# Patient Record
Sex: Female | Born: 1980 | Hispanic: Yes | Marital: Married | Smoking: Never smoker
Health system: Southern US, Community
[De-identification: ages and names within clinical notes are randomized; demographics above are authoritative.]

## PROBLEM LIST (undated history)

## (undated) HISTORY — PX: BREAST CYST EXCISION: SHX579

---

## 2019-07-31 ENCOUNTER — Emergency Department
Admission: EM | Admit: 2019-07-31 | Discharge: 2019-07-31 | Disposition: A | Discharge status: Home / Self Care | Payer: Medicaid - Out of State | Attending: Emergency Medicine | Admitting: Emergency Medicine

## 2019-07-31 ENCOUNTER — Other Ambulatory Visit: Payer: Self-pay

## 2019-07-31 DIAGNOSIS — M436 Torticollis: Secondary | ICD-10-CM | POA: Insufficient documentation

## 2019-07-31 MED ORDER — CYCLOBENZAPRINE HCL 10 MG PO TABS: 10.0000 mg | ORAL_TABLET | Freq: Three times a day (TID) | ORAL | 0 refills | Status: DC | PRN

## 2019-07-31 MED ORDER — CYCLOBENZAPRINE HCL 10 MG PO TABS
10.0000 mg | ORAL_TABLET | Freq: Once | ORAL | Status: AC
  Administered 2019-07-31: 10 mg via ORAL
  Filled 2019-07-31: qty 1

## 2019-07-31 NOTE — ED Provider Notes (Signed)
[facility] Emergency Department Provider Note _____   First MD Initiated Contact with Patient 07/31/19 0308     (approximate)  I have reviewed the triage vital signs and the nursing notes.   HISTORY  Chief Complaint Torticollis   HPI [name] is a 38 y.o. female [place] Jansky to the emergency department secondary to left lateral neck discomfort worse with movement since this morning.  Patient states that pain is worsened with movement of the neck.  Patient denies any trauma.  Patient denies any fever.  Patient denies any upper or lower extremity weakness numbness.  Patient denies any gait instability or visual changes.        No past medical history on file.  There are no active problems to display for this patient.     Prior to Admission medications   Medication Sig Start Date End Date Taking? Authorizing Provider  cyclobenzaprine (FLEXERIL) 10 MG tablet Take 1 tablet (10 mg total) by mouth 3 (three) times daily as needed. 07/31/19   Darci Current, MD    Allergies Patient has no known allergies.  No family history on file.  Social History Social History   Tobacco Use  . Smoking status: Not on file  Substance Use Topics  . Alcohol use: Not on file  . Drug use: Not on file    Review of Systems Constitutional: No fever/chills Eyes: No visual changes. ENT: No sore throat. Cardiovascular: Denies chest pain. Respiratory: Denies shortness of breath. Gastrointestinal: No abdominal pain.  No nausea, no vomiting.  No diarrhea.  No constipation. Genitourinary: Negative for dysuria. Musculoskeletal: Negative for neck pain.  Negative for back pain. Integumentary: Negative for rash. Neurological: Negative for headaches, focal weakness or numbness.   ____________________________________________   PHYSICAL EXAM:  VITAL SIGNS: ED Triage Vitals  Enc Vitals Group     BP 07/31/19 0030 118/81     Pulse Rate 07/31/19 0030 63   Resp 07/31/19 0030 20     Temp 07/31/19 0030 98 F (36.7 C)     Temp Source 07/31/19 0030 Oral     SpO2 07/31/19 0325 98 %     Weight 07/31/19 0030 73 kg (161 lb)     Height 07/31/19 0030 1.676 m (5\' 6" )     Head Circumference --      Peak Flow --      Pain Score 07/31/19 0030 8     Pain Loc --      Pain Edu? --      Excl. in GC? --     Constitutional: Alert and oriented.  Eyes: Conjunctivae are normal.  Head: Atraumatic.09/30/19 Mouth/Throat: Mucous membranes are moist. Neck: No stridor.  No meningeal signs.   Cardiovascular: Normal rate, regular rhythm. Good peripheral circulation. Grossly normal heart sounds. Respiratory: Normal respiratory effort.  No retractions. Gastrointestinal: Soft and nontender. No distention.  Musculoskeletal: No lower extremity tenderness nor edema. No gross deformities of extremities.  Pain to palpation of the left trapezius muscle.  Pain with active and passive range of motion of the neck Neurologic:  Normal speech and language. No gross focal neurologic deficits are appreciated.  Skin:  Skin is warm, dry and intact. Psychiatric: Mood and affect are normal. Speech and behavior are normal.  _______________  Procedures   ____________________________________________   INITIAL IMPRESSION / MDM / ASSESSMENT AND PLAN / ED COURSE  As part of my medical decision making, I reviewed the following data within the electronic MEDICAL RECORD NUMBER  38 year old female presented with above-stated history and physical exam consistent with acute torticollis.  Patient given Flexeril in the emergency department will be prescribed the same for home.  Given absence of any neurological deficits or signs of cervical radiculopathy imaging not performed  ____________________________________________  FINAL CLINICAL IMPRESSION(S) / ED DIAGNOSES  Final diagnoses:  Torticollis     MEDICATIONS GIVEN DURING THIS VISIT:  Medications  cyclobenzaprine (FLEXERIL) tablet 10 mg  (10 mg Oral Given 07/31/19 0326)     ED Discharge Orders         Ordered    cyclobenzaprine (FLEXERIL) 10 MG tablet  3 times daily PRN     07/31/19 0309          *Please note:  April West was evaluated in Emergency Department on 07/31/2019 for the symptoms described in the history of present illness. She was evaluated in the context of the global COVID-19 pandemic, which necessitated consideration that the patient might be at risk for infection with the SARS-CoV-2 virus that causes COVID-19. Institutional protocols and algorithms that pertain to the evaluation of patients at risk for COVID-19 are in a state of rapid change based on information released by regulatory bodies including the CDC and federal and state organizations. These policies and algorithms were followed during the patient's care in the ED.  Some ED evaluations and interventions may be delayed as a result of limited staffing during the pandemic.*  Note:  This document was prepared using Dragon voice recognition software and may include unintentional dictation errors.   Gregor Hams, MD 07/31/19 [phone]

## 2019-07-31 NOTE — ED Triage Notes (Signed)
Pt in with co left side neck pain radiates to left upper back, states she thinks it is tension. Pt unable to turn head or move neck.

## 2020-01-07 ENCOUNTER — Encounter (HOSPITAL_COMMUNITY): Payer: Self-pay | Admitting: Emergency Medicine

## 2020-01-07 ENCOUNTER — Emergency Department (HOSPITAL_COMMUNITY)
Admission: EM | Admit: 2020-01-07 | Discharge: 2020-01-08 | Disposition: A | Discharge status: Home / Self Care | Payer: No Typology Code available for payment source | Attending: Emergency Medicine | Admitting: Emergency Medicine

## 2020-01-07 ENCOUNTER — Emergency Department (HOSPITAL_COMMUNITY): Payer: No Typology Code available for payment source

## 2020-01-07 ENCOUNTER — Ambulatory Visit (INDEPENDENT_AMBULATORY_CARE_PROVIDER_SITE_OTHER): Payer: Self-pay | Admitting: Certified Nurse Midwife

## 2020-01-07 ENCOUNTER — Encounter: Payer: Self-pay | Admitting: Certified Nurse Midwife

## 2020-01-07 ENCOUNTER — Other Ambulatory Visit: Payer: Self-pay

## 2020-01-07 VITALS — BP 102/68 | HR 67 | Wt 157.7 lb

## 2020-01-07 DIAGNOSIS — S60512A Abrasion of left hand, initial encounter: Secondary | ICD-10-CM | POA: Diagnosis not present

## 2020-01-07 DIAGNOSIS — S30811A Abrasion of abdominal wall, initial encounter: Secondary | ICD-10-CM | POA: Insufficient documentation

## 2020-01-07 DIAGNOSIS — Y999 Unspecified external cause status: Secondary | ICD-10-CM | POA: Diagnosis not present

## 2020-01-07 DIAGNOSIS — M79662 Pain in left lower leg: Secondary | ICD-10-CM | POA: Insufficient documentation

## 2020-01-07 DIAGNOSIS — R0789 Other chest pain: Secondary | ICD-10-CM | POA: Insufficient documentation

## 2020-01-07 DIAGNOSIS — S92325A Nondisplaced fracture of second metatarsal bone, left foot, initial encounter for closed fracture: Secondary | ICD-10-CM | POA: Insufficient documentation

## 2020-01-07 DIAGNOSIS — S70211A Abrasion, right hip, initial encounter: Secondary | ICD-10-CM | POA: Diagnosis not present

## 2020-01-07 DIAGNOSIS — S70212A Abrasion, left hip, initial encounter: Secondary | ICD-10-CM | POA: Diagnosis not present

## 2020-01-07 DIAGNOSIS — S99922A Unspecified injury of left foot, initial encounter: Secondary | ICD-10-CM | POA: Diagnosis present

## 2020-01-07 DIAGNOSIS — M79651 Pain in right thigh: Secondary | ICD-10-CM | POA: Diagnosis not present

## 2020-01-07 DIAGNOSIS — R1084 Generalized abdominal pain: Secondary | ICD-10-CM | POA: Diagnosis not present

## 2020-01-07 DIAGNOSIS — Y92410 Unspecified street and highway as the place of occurrence of the external cause: Secondary | ICD-10-CM | POA: Diagnosis not present

## 2020-01-07 DIAGNOSIS — R52 Pain, unspecified: Secondary | ICD-10-CM

## 2020-01-07 DIAGNOSIS — S20312A Abrasion of left front wall of thorax, initial encounter: Secondary | ICD-10-CM | POA: Insufficient documentation

## 2020-01-07 DIAGNOSIS — S90811A Abrasion, right foot, initial encounter: Secondary | ICD-10-CM | POA: Diagnosis not present

## 2020-01-07 DIAGNOSIS — M25571 Pain in right ankle and joints of right foot: Secondary | ICD-10-CM | POA: Diagnosis not present

## 2020-01-07 DIAGNOSIS — S60511A Abrasion of right hand, initial encounter: Secondary | ICD-10-CM | POA: Insufficient documentation

## 2020-01-07 DIAGNOSIS — S93325A Dislocation of tarsometatarsal joint of left foot, initial encounter: Secondary | ICD-10-CM

## 2020-01-07 DIAGNOSIS — Z3009 Encounter for other general counseling and advice on contraception: Secondary | ICD-10-CM

## 2020-01-07 DIAGNOSIS — T148XXA Other injury of unspecified body region, initial encounter: Secondary | ICD-10-CM

## 2020-01-07 DIAGNOSIS — Y9389 Activity, other specified: Secondary | ICD-10-CM | POA: Insufficient documentation

## 2020-01-07 LAB — I-STAT BETA HCG BLOOD, ED (MC, WL, AP ONLY): I-stat hCG, quantitative: <5 m[IU]/mL (ref <5)

## 2020-01-07 LAB — COMPREHENSIVE METABOLIC PANEL
ALT: 22 U/L (ref 0–44)
AST: 34 U/L (ref 15–41)
Albumin: 4.3 g/dL (ref 3.5–5.0)
Alkaline Phosphatase: 76 U/L (ref 38–126)
Anion gap: 13 (ref 5–15)
BUN: 16 mg/dL (ref 6–20)
CO2: 20 mmol/L — ABNORMAL LOW (ref 22–32)
Calcium: 9.6 mg/dL (ref 8.9–10.3)
Chloride: 105 mmol/L (ref 98–111)
Creatinine, Ser: 0.73 mg/dL (ref 0.44–1.00)
GFR calc Af Amer: >60 mL/min (ref >60)
GFR calc non Af Amer: >60 mL/min (ref >60)
Glucose, Bld: 95 mg/dL (ref 70–99)
Potassium: 3.8 mmol/L (ref 3.5–5.1)
Sodium: 138 mmol/L (ref 135–145)
Total Bilirubin: 0.4 mg/dL (ref 0.3–1.2)
Total Protein: 7.6 g/dL (ref 6.5–8.1)

## 2020-01-07 LAB — CBC WITH DIFFERENTIAL/PLATELET
Abs Immature Granulocytes: 0.02 10*3/uL (ref 0.00–0.07)
Basophils Absolute: 0 10*3/uL (ref 0.0–0.1)
Basophils Relative: 0 %
Eosinophils Absolute: 0.1 10*3/uL (ref 0.0–0.5)
Eosinophils Relative: 2 %
HCT: 44.5 % (ref 36.0–46.0)
Hemoglobin: 14.3 g/dL (ref 12.0–15.0)
Immature Granulocytes: 0 %
Lymphocytes Relative: 43 %
Lymphs Abs: 3.2 10*3/uL (ref 0.7–4.0)
MCH: 30.8 pg (ref 26.0–34.0)
MCHC: 32.1 g/dL (ref 30.0–36.0)
MCV: 95.9 fL (ref 80.0–100.0)
Monocytes Absolute: 0.4 10*3/uL (ref 0.1–1.0)
Monocytes Relative: 6 %
Neutro Abs: 3.6 10*3/uL (ref 1.7–7.7)
Neutrophils Relative %: 49 %
Platelets: 233 10*3/uL (ref 150–400)
RBC: 4.64 MIL/uL (ref 3.87–5.11)
RDW: 12.3 % (ref 11.5–15.5)
WBC: 7.4 10*3/uL (ref 4.0–10.5)
nRBC: 0 % (ref 0.0–0.2)

## 2020-01-07 MED ORDER — ONDANSETRON HCL 4 MG/2ML IJ SOLN
4.0000 mg | Freq: Once | INTRAMUSCULAR | Status: AC
  Administered 2020-01-07: 4 mg via INTRAVENOUS

## 2020-01-07 MED ORDER — ONDANSETRON HCL 4 MG/2ML IJ SOLN
4.0000 mg | Freq: Once | INTRAMUSCULAR | Status: AC
  Administered 2020-01-07: 4 mg via INTRAVENOUS
  Filled 2020-01-07: qty 2

## 2020-01-07 MED ORDER — IOHEXOL 300 MG/ML  SOLN
100.0000 mL | Freq: Once | INTRAMUSCULAR | Status: AC | PRN
  Administered 2020-01-07: 100 mL via INTRAVENOUS

## 2020-01-07 MED ORDER — OXYCODONE-ACETAMINOPHEN 5-325 MG PO TABS
1.0000 | ORAL_TABLET | Freq: Once | ORAL | Status: AC
  Administered 2020-01-07: 1 via ORAL
  Filled 2020-01-07: qty 1

## 2020-01-07 MED ORDER — HYDROCODONE-ACETAMINOPHEN 5-325 MG PO TABS: 1.0000 | ORAL_TABLET | Freq: Four times a day (QID) | ORAL | 0 refills | Status: DC | PRN

## 2020-01-07 MED ORDER — ONDANSETRON HCL 4 MG/2ML IJ SOLN
INTRAMUSCULAR | Status: AC
  Filled 2020-01-07: qty 2

## 2020-01-07 MED ORDER — FENTANYL CITRATE (PF) 100 MCG/2ML IJ SOLN
50.0000 ug | Freq: Once | INTRAMUSCULAR | Status: AC
  Administered 2020-01-07: 50 ug via INTRAVENOUS
  Filled 2020-01-07: qty 2

## 2020-01-07 MED ORDER — SODIUM CHLORIDE 0.9 % IV BOLUS
1000.0000 mL | Freq: Once | INTRAVENOUS | Status: AC
  Administered 2020-01-07: 1000 mL via INTRAVENOUS

## 2020-01-07 MED ORDER — MORPHINE SULFATE (PF) 4 MG/ML IV SOLN
4.0000 mg | Freq: Once | INTRAVENOUS | Status: AC
  Administered 2020-01-07: 4 mg via INTRAVENOUS
  Filled 2020-01-07: qty 1

## 2020-01-07 NOTE — ED Notes (Signed)
Pt to and from xray and CT then back to room with no distress noted.
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
## 2020-01-07 NOTE — Progress Notes (Signed)
Orthopedic Tech Progress Note Patient Details:  April West 02-25-1981 [id number]  Ortho Devices Type of Ortho Device: Crutches, CAM walker Ortho Device/Splint Location: LLE Ortho Device/Splint Interventions: Ordered, Application, Adjustment   Post Interventions Patient Tolerated: Fair Instructions Provided: Poper ambulation with device, Care of device, Adjustment of device   [name] N Kayman Snuffer 01/07/2020, 11:46 PM

## 2020-01-07 NOTE — Progress Notes (Signed)
Pt here today to discuss IUD removal. Pt recently moved from [place] and would like to transfer GYN care to our office; pt will sign ROI before leaving office today. Medical and surgical hx reviewed with pt. Pt declines STD testing. Reports hx of abnormal PAP, most recent PAP result was normal per pt and completed within the past 2 years.   Pt states she is having pain to RLQ of abdomen and has been previously seen in [place] for this complaint. [place] completed at that time and pt told she has fibroids of her uterus. Pt states she was told this may be causing her pain and provider recommended she have the IUD removed; however pt did not want to use a different contraceptive so procedure was declined. Pt declines need for contraception at this time; current partner has had a vasectomy. Pt states she would like to have IUD removed today. 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 Pt would like to defer removal until her additional insurance has been approved.   Pt will follow up with our office for IUD removal once insurance has been approved. Front office notified to obtain ROI from patient.  Fleet Contras RN 01/07/20
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
## 2020-01-08 NOTE — ED Notes (Signed)
Pt was discharged from the ED. Pt read and understood discharge paperwork. Pt had vital signs completed. Pt conscious, breathing, and A&Ox4. No distress noted. Pt speaking in complete sentences. Pt brought out of the ED via wheelchair. E-signature not available. 

## 2020-01-11 NOTE — Progress Notes (Signed)
Chart reviewed for nurse visit. Agree with plan of care.   Donette Larry, CNM 01/11/2020 9:49 PM

## 2020-01-15 ENCOUNTER — Telehealth: Payer: Self-pay | Admitting: Family Medicine

## 2020-01-15 NOTE — Telephone Encounter (Signed)
Called the patient to inform of the denial of medical records from [facility]. The company stated they can only e-mail the records. Informed the patient and requested the patient request the records be emailed to her and if she could print them and provide the information to the office. The patient agreed and stated she would place a request for the information.

## 2020-01-19 ENCOUNTER — Ambulatory Visit (HOSPITAL_COMMUNITY)
Admission: EM | Admit: 2020-01-19 | Discharge: 2020-01-19 | Disposition: A | Discharge status: Home / Self Care | Payer: Medicaid Other | Attending: Internal Medicine | Admitting: Internal Medicine

## 2020-01-19 ENCOUNTER — Encounter (HOSPITAL_COMMUNITY): Payer: Self-pay | Admitting: Emergency Medicine

## 2020-01-19 ENCOUNTER — Other Ambulatory Visit: Payer: Self-pay

## 2020-01-19 DIAGNOSIS — S301XXD Contusion of abdominal wall, subsequent encounter: Secondary | ICD-10-CM
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
## 2020-03-08 ENCOUNTER — Other Ambulatory Visit: Payer: Self-pay | Admitting: Nurse Practitioner

## 2020-03-08 DIAGNOSIS — R102 Pelvic and perineal pain: Secondary | ICD-10-CM

## 2020-03-10 ENCOUNTER — Ambulatory Visit
Admission: RE | Admit: 2020-03-10 | Discharge: 2020-03-10 | Disposition: A | Discharge status: Home / Self Care | Payer: 59 | Source: Ambulatory Visit | Attending: Nurse Practitioner | Admitting: Nurse Practitioner

## 2020-03-10 DIAGNOSIS — R102 Pelvic and perineal pain: Secondary | ICD-10-CM

## 2020-06-22 ENCOUNTER — Other Ambulatory Visit: Payer: Medicaid Other

## 2020-06-22 ENCOUNTER — Other Ambulatory Visit: Payer: Self-pay | Admitting: Critical Care Medicine

## 2020-06-22 DIAGNOSIS — Z20822 Contact with and (suspected) exposure to covid-19: Secondary | ICD-10-CM

## 2020-06-23 LAB — NOVEL CORONAVIRUS, NAA: SARS-CoV-2, NAA: NOT DETECTED

## 2021-01-13 ENCOUNTER — Other Ambulatory Visit: Payer: Self-pay | Admitting: Endocrinology

## 2021-01-13 DIAGNOSIS — Z1231 Encounter for screening mammogram for malignant neoplasm of breast: Secondary | ICD-10-CM

## 2021-03-06 IMAGING — DX DG FOOT COMPLETE 3+V*R*
3 series · 3 of 3 positions shown · non-contrast
Comparison: None.

CLINICAL DATA: Pain

EXAM:
RIGHT FOOT COMPLETE - 3+ VIEW

[foot ap]
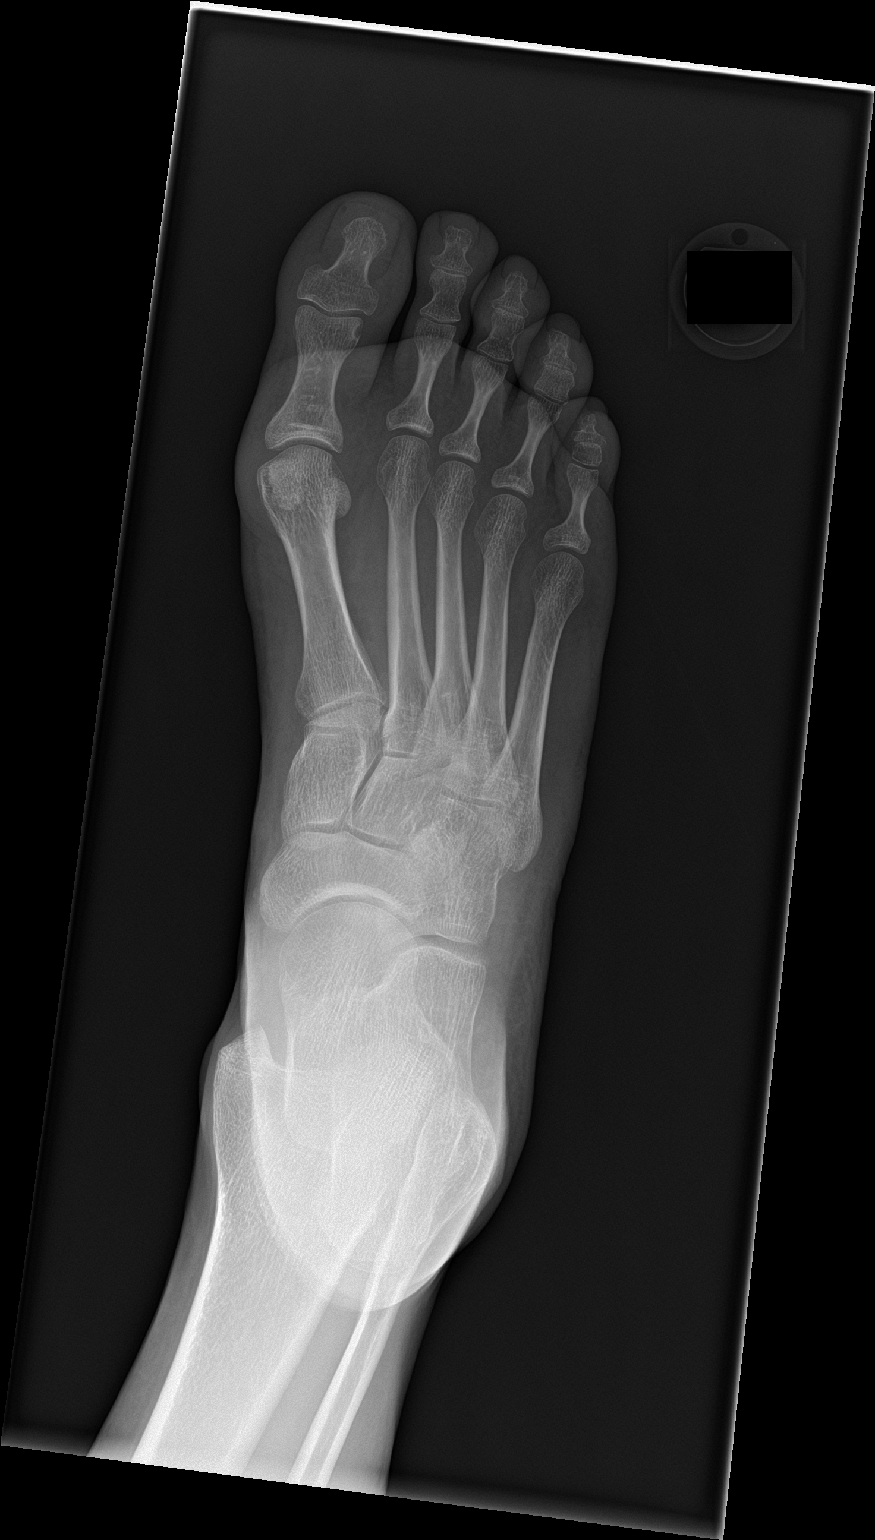

[foot obl]
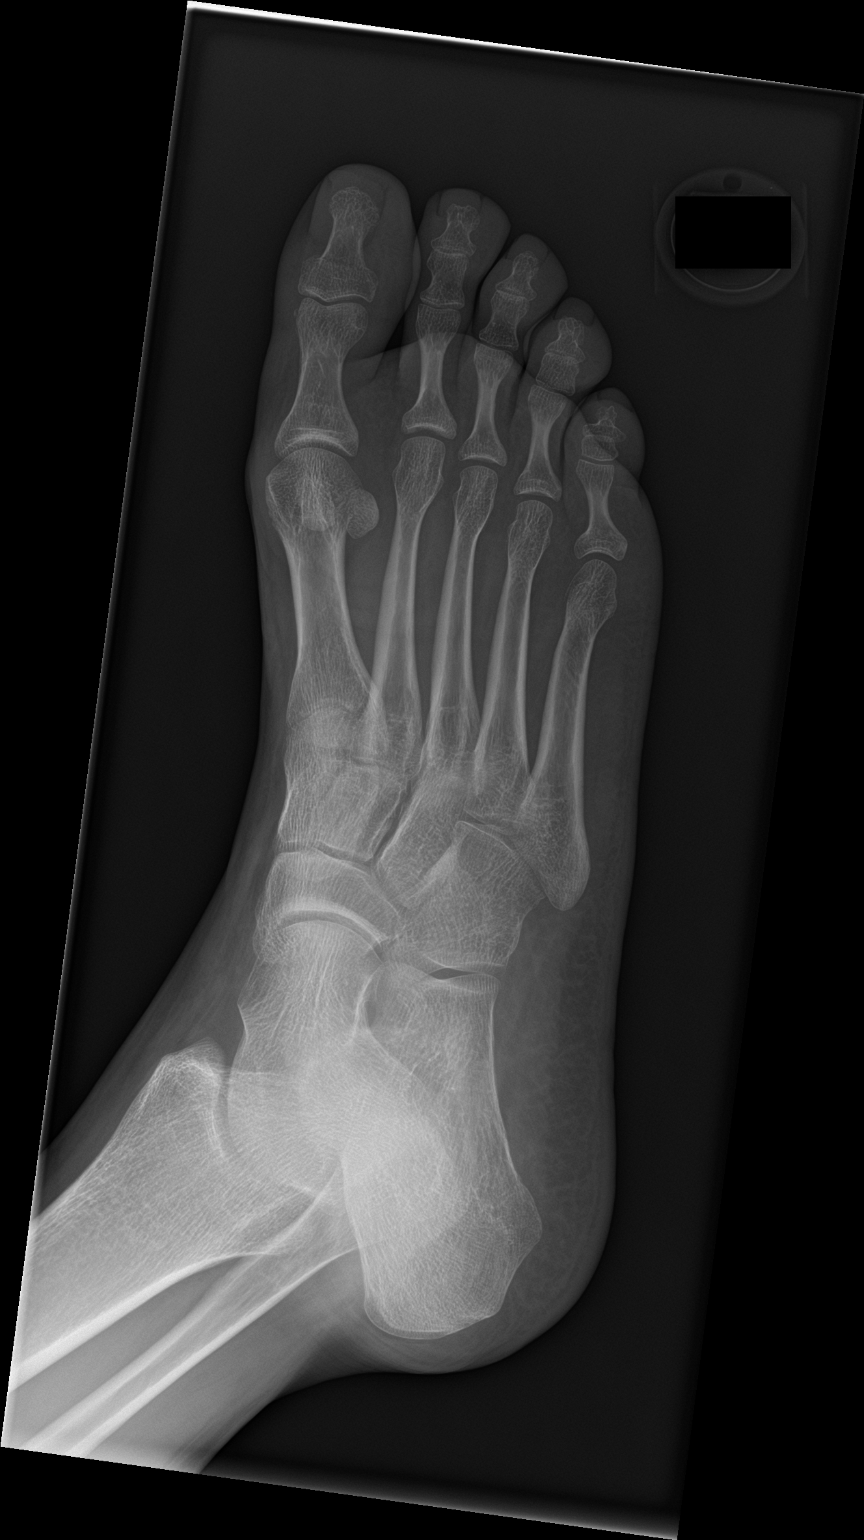

[foot lat]
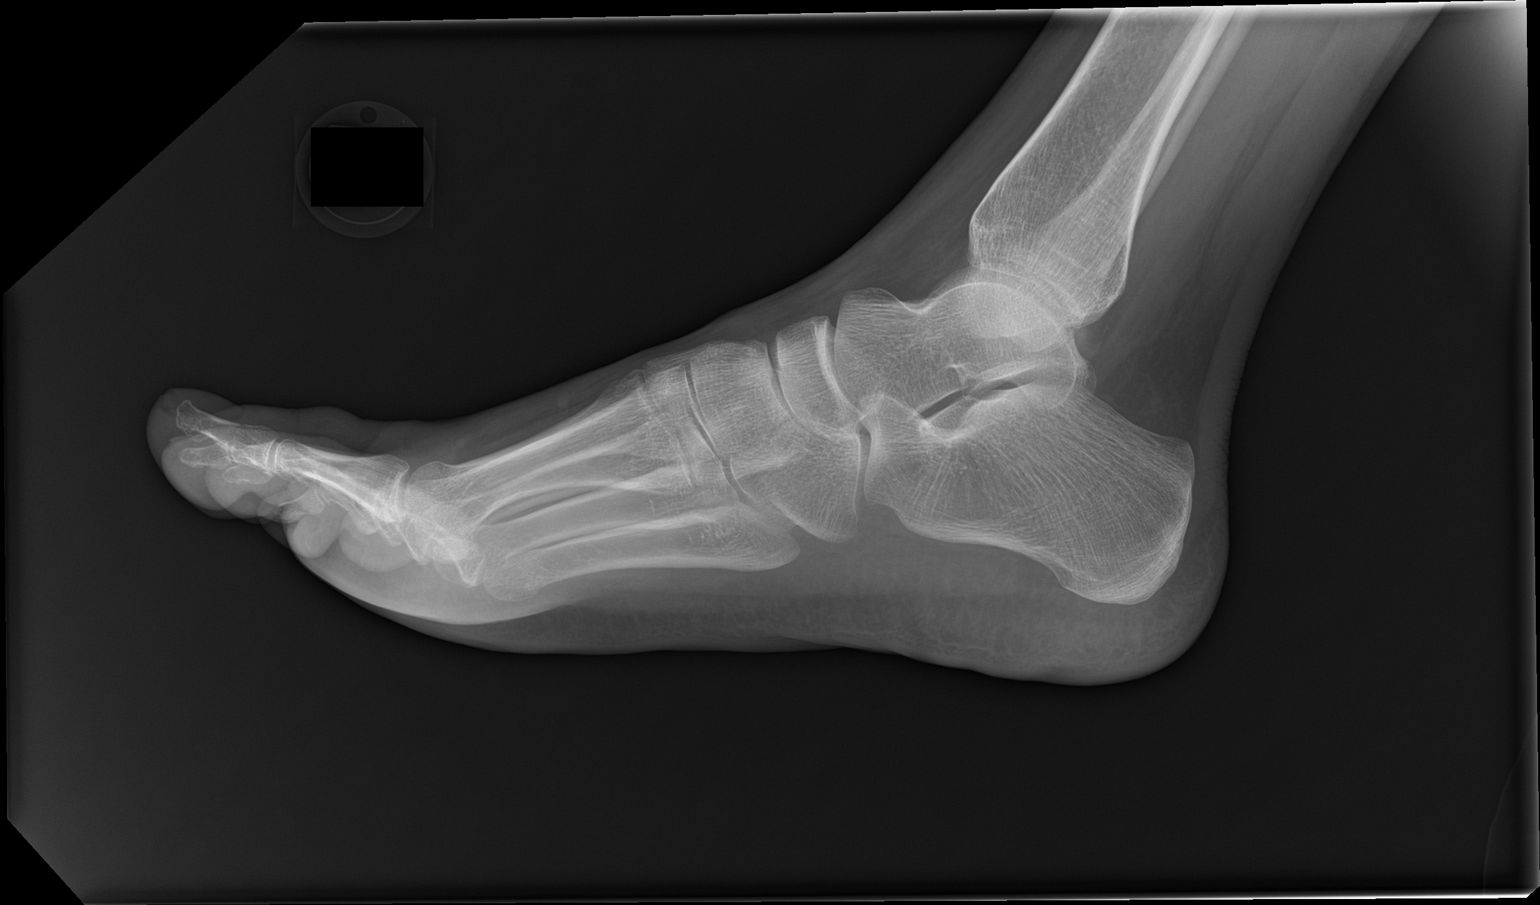

[3 of 3 positions shown; findings below may reference images not displayed]

FINDINGS: There is no evidence of fracture or dislocation. There is no
evidence of arthropathy or other focal bone abnormality. Soft
tissues are unremarkable.
IMPRESSION: Negative.

## 2021-03-06 IMAGING — DX DG FEMUR 2+V*R*
4 series · 4 of 4 positions shown · non-contrast
Comparison: None.

CLINICAL DATA: Pain

EXAM:
RIGHT FEMUR 2 VIEWS

[femur ap (1 of 2)]
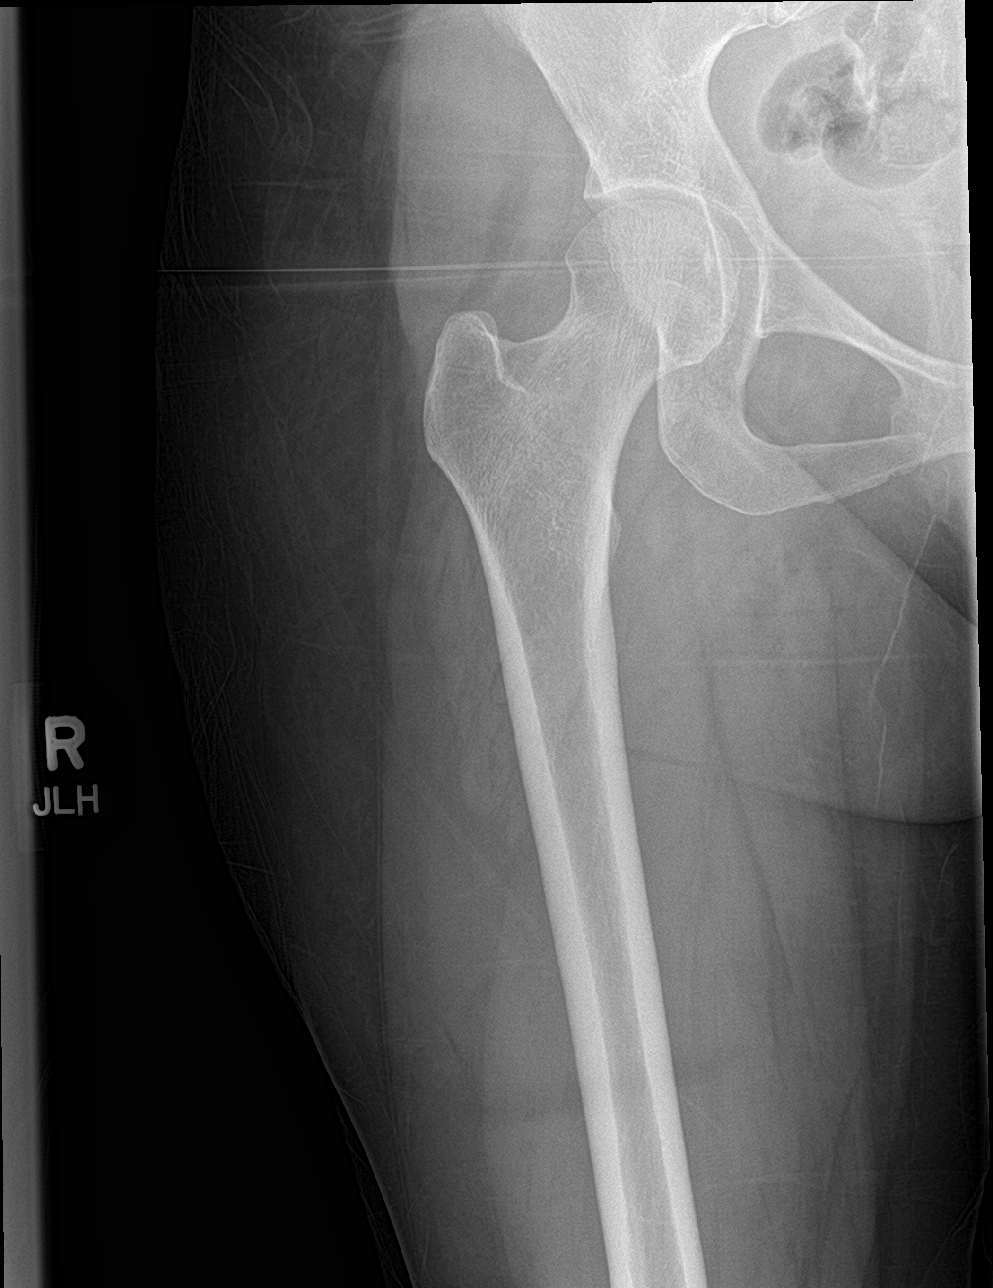

[femur ap (2 of 2)]
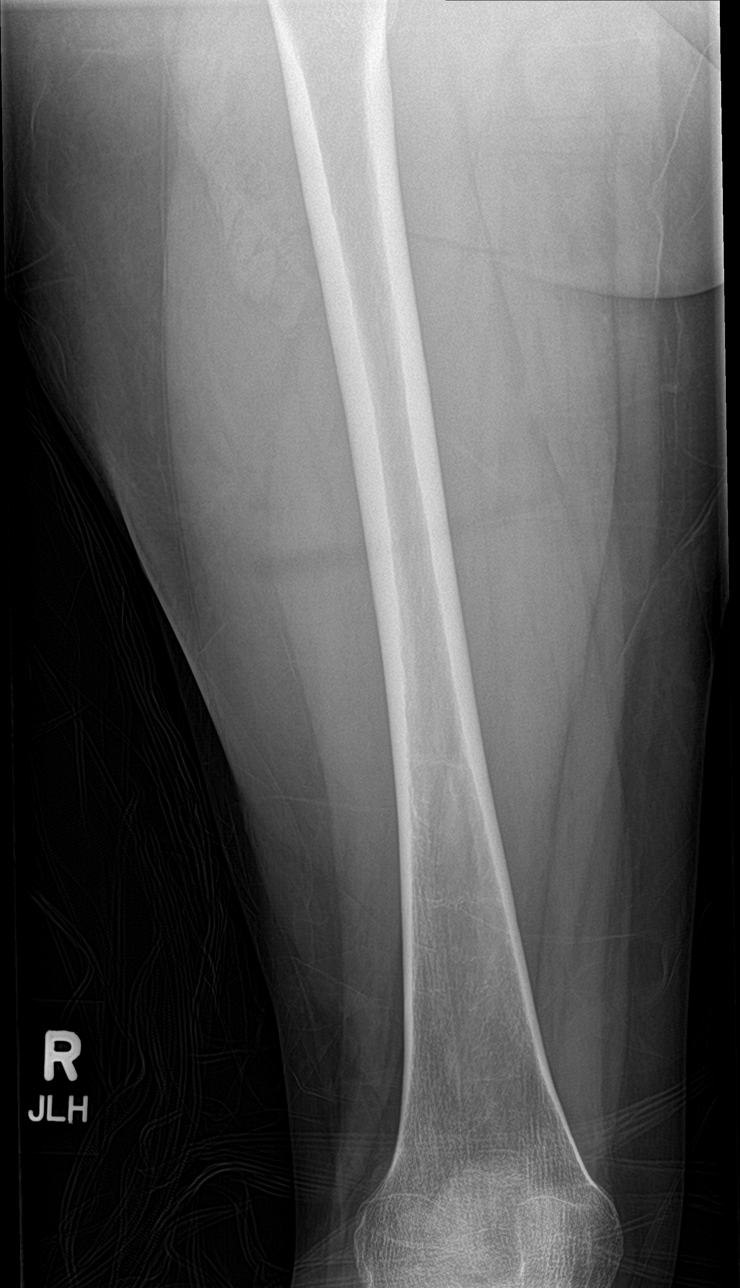

[femur lat (1 of 2)]
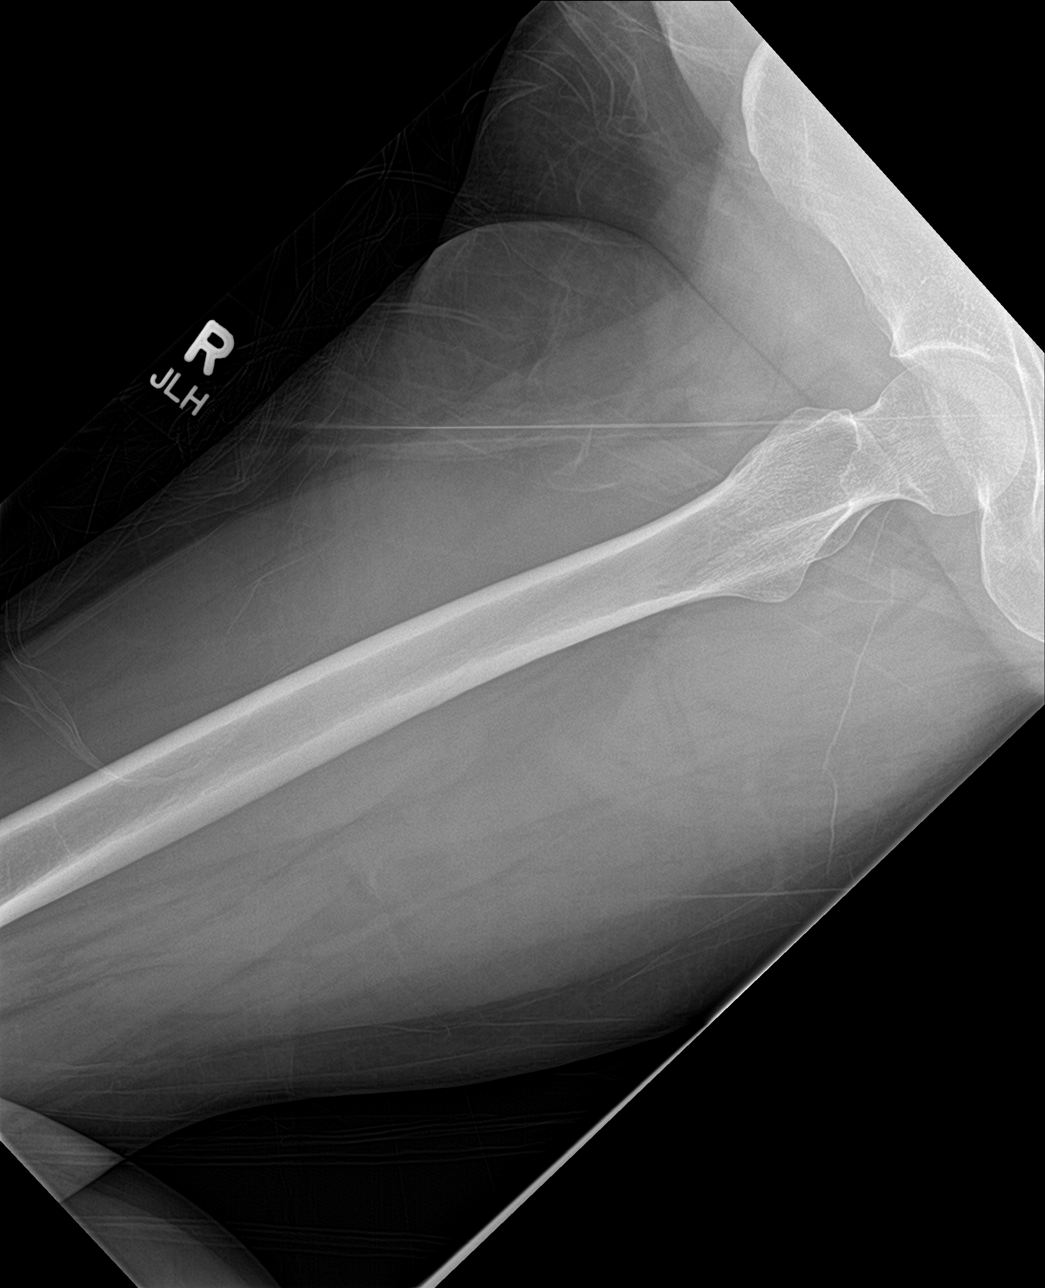

[femur lat (2 of 2)]
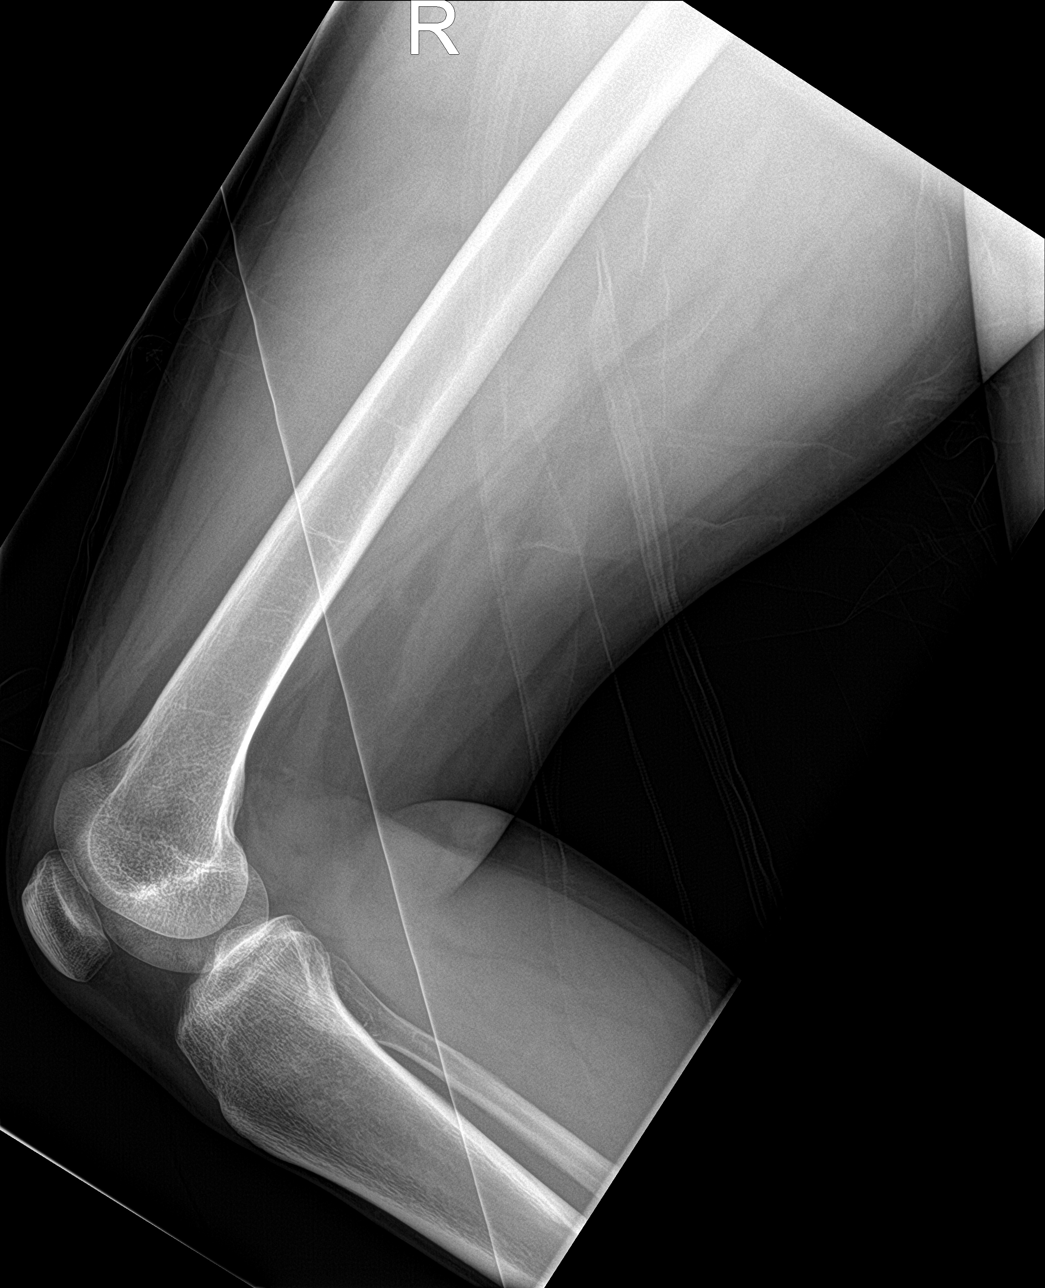

[4 of 4 positions shown; findings below may reference images not displayed]

FINDINGS: There is no evidence of fracture or other focal bone lesions. Soft
tissues are unremarkable.
IMPRESSION: Negative.

## 2021-03-06 IMAGING — DX DG TIBIA/FIBULA 2V*L*
2 series · 2 of 2 positions shown · non-contrast
Comparison: None.

CLINICAL DATA: Pain

EXAM:
LEFT TIBIA AND FIBULA - 2 VIEW

[tibia ap]
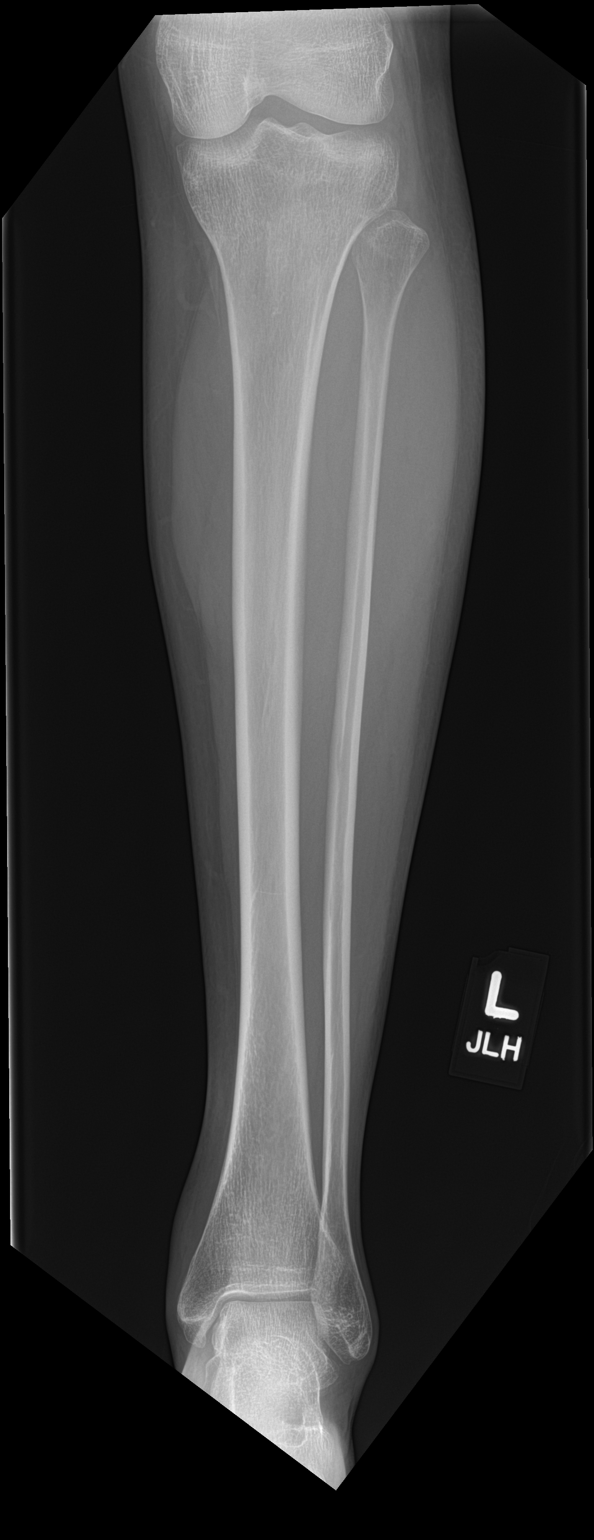

[tibia lat]
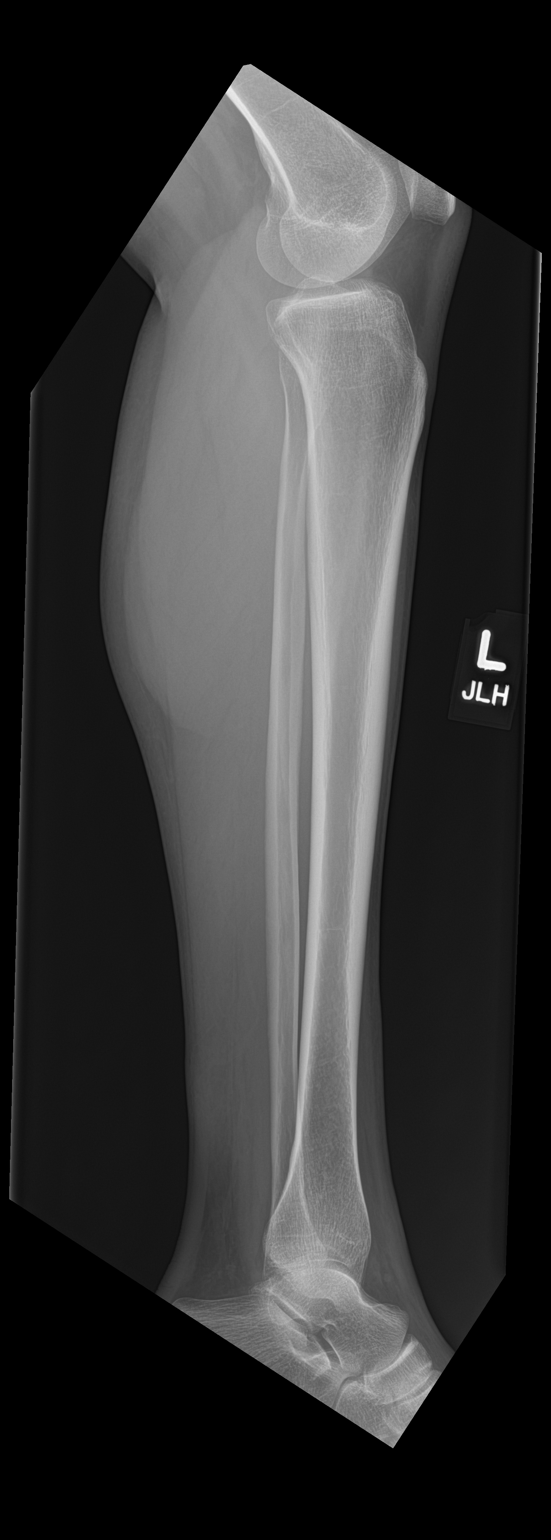

[2 of 2 positions shown; findings below may reference images not displayed]

FINDINGS: There is no evidence of fracture or other focal bone lesions. Soft
tissues are unremarkable.
IMPRESSION: Negative.

## 2021-03-08 ENCOUNTER — Ambulatory Visit: Payer: Medicaid Other

## 2021-05-01 ENCOUNTER — Inpatient Hospital Stay: Admission: RE | Admit: 2021-05-01 | Payer: Medicaid Other | Source: Ambulatory Visit

## 2021-05-29 ENCOUNTER — Ambulatory Visit (INDEPENDENT_AMBULATORY_CARE_PROVIDER_SITE_OTHER): Payer: 59

## 2021-05-29 ENCOUNTER — Encounter (HOSPITAL_COMMUNITY): Payer: Self-pay

## 2021-05-29 ENCOUNTER — Other Ambulatory Visit: Payer: Self-pay

## 2021-05-29 ENCOUNTER — Ambulatory Visit (HOSPITAL_COMMUNITY)
Admission: EM | Admit: 2021-05-29 | Discharge: 2021-05-29 | Disposition: A | Discharge status: Home / Self Care | Payer: 59 | Attending: Emergency Medicine | Admitting: Emergency Medicine

## 2021-05-29 DIAGNOSIS — S8012XA Contusion of left lower leg, initial encounter: Secondary | ICD-10-CM

## 2021-05-29 DIAGNOSIS — M79605 Pain in left leg: Secondary | ICD-10-CM

## 2021-05-29 MED ORDER — IBUPROFEN 600 MG PO TABS: 600.0000 mg | ORAL_TABLET | Freq: Four times a day (QID) | ORAL | 0 refills | Status: AC | PRN
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
## 2021-05-29 NOTE — Discharge Instructions (Addendum)
This may take an additional 3 to 4 weeks to completely heal.  Your x-rays negative for fracture.  I suspect that you have a soft tissue contusion and bone contusion.  Continue ice, elevation.  600 mg of ibuprofen combined with gauze milligrams of Tylenol together 3-4 times a day as needed for pain.
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
